# Patient Record
Sex: Female | Born: 1965 | Race: White | Hispanic: No | Marital: Married | Smoking: Never smoker
Health system: Southern US, Community
[De-identification: ages and names within clinical notes are randomized; demographics above are authoritative.]

---

## 2017-01-01 ENCOUNTER — Ambulatory Visit (INDEPENDENT_AMBULATORY_CARE_PROVIDER_SITE_OTHER): Payer: BC Managed Care – PPO | Admitting: Sports Medicine

## 2017-01-01 ENCOUNTER — Ambulatory Visit
Admission: RE | Admit: 2017-01-01 | Discharge: 2017-01-01 | Disposition: A | Discharge status: Home / Self Care | Payer: BC Managed Care – PPO | Source: Ambulatory Visit | Attending: Sports Medicine | Admitting: Sports Medicine

## 2017-01-01 VITALS — BP 150/79 | Ht 63.5 in | Wt 130.0 lb

## 2017-01-01 DIAGNOSIS — M25571 Pain in right ankle and joints of right foot: Secondary | ICD-10-CM | POA: Diagnosis not present

## 2017-01-01 NOTE — Progress Notes (Signed)
   Subjective:    Patient ID: Tiffany Russo, female    DOB: 1966/09/12, 51 y.o.   MRN: [id number]  HPI chief complaint: Right ankle pain  Very pleasant 51 year old runner comes in today complaining of 6 weeks of medial right ankle pain. No injury that she can recall but a gradual onset of pain that is most noticeable at the end of her runs. She is currently training for a half marathon. Her race is in 3 weeks. She does not have pain while running. She localizes her pain to the tip of her medial malleolus. She has noticed some swelling here. She has tried a compression sleeve and has found that to be helpful. She denies significant problems with her ankles in the past. She does admit to increasing her mileage 2-3 weeks prior to her pain.  Past medical history reviewed Medications reviewed Allergies reviewed    Review of Systems As above    Objective:   Physical Exam  Well-developed, fit appearing. No acute distress. Awake alert and oriented 3. Vital signs reviewed  Right ankle: Full range of motion. There is a mild amount of soft tissue swelling localized to the very inferior tip of the medial malleolus. There is a little bit of erythema and discoloration at this area. It is not warm to touch. No fluctuance. She is tender to palpation here. There is no other tenderness to palpation or percussion along the rest of the medial malleolus or along the distal third of the tibia. No tenderness to palpation along the posterior tibialis tendon. No ankle effusion.  Pes planus with standing. Patient is a midfoot striker when running and pronates bilaterally, right greater than left.  MSK ultrasound of the right ankle was performed. Limited images were obtained. There is a small area of swelling along the distal tip of the medial malleolus just proximal to the ankle joint. There is no cortical irregularity and no neovascularity. X-rays of the right ankle including AP and lateral views show no obvious  stress fracture.       Assessment & Plan:   Right ankle pain and swelling likely secondary to mild medial malleolar stress reaction  Since her pain is localized to the very inferior most portion of the medial malleolus and does not appear to involve the midportion of the malleolus I think that this injury is likely more biomechanical in nature due to her pes planus. I fitted her running shoes with scaphoid pads and she will need to decrease her running leading up to her half marathon. Since I do not see any obvious evidence of either a stress reaction or stress fracture I think she is okay to continue with her half marathon as scheduled. However, she is instructed to follow-up with me the week after her race for reevaluation. If her symptoms do not improve in the interim then I would consider further diagnostic imaging at follow-up. She may continue with her compression sleeve while running and I've also encouraged her to use ice after running.

## 2017-01-22 ENCOUNTER — Ambulatory Visit (INDEPENDENT_AMBULATORY_CARE_PROVIDER_SITE_OTHER): Payer: BC Managed Care – PPO | Admitting: Sports Medicine

## 2017-01-22 ENCOUNTER — Encounter: Payer: Self-pay | Admitting: Sports Medicine

## 2017-01-22 VITALS — BP 129/77 | Ht 63.5 in | Wt 130.0 lb

## 2017-01-22 DIAGNOSIS — M76821 Posterior tibial tendinitis, right leg: Secondary | ICD-10-CM | POA: Diagnosis not present

## 2017-01-22 NOTE — Progress Notes (Signed)
   Subjective:    Patient ID: Tiffany Russo, female    DOB: 1965-12-20, 51 y.o.   MRN: [id number]  HPI  Patient comes in today for follow-up on right ankle pain. Overall, her pain has improved. She did find the scaphoid pads to be helpful. She was able to complete her half marathon without any problem. She does still endorse some mild discomfort along the medial ankle. She has also found that wearing a compression sleeve is helpful.      Review of Systems     as above  Objective:   Physical Exam   Well-developed, well-nourished. No acute distress  Right ankle: Full range of motion. No effusion. No soft tissue swelling. She is tender to palpation along the posterior tibialis tendon just inferior to the medial malleolus. No pain with standing on tiptoes. Good strength. Neurovascularly intact distally. Pes planus with standing.  Brief MSK ultrasound today shows fluid around the posterior tibialis tendon.     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 She is also tender to palpation here. She has noticed improvement with scaphoid pads so we will schedule a follow-up appointment next week for custom orthotics. She will continue with her compression sleeve when running. I've also educated her in a modified eccentric heel drop with the foot in a pigeon toe position to isolate the posterior tibialis tendon.

## 2017-01-29 ENCOUNTER — Encounter: Payer: BC Managed Care – PPO | Admitting: Sports Medicine

## 2017-01-31 ENCOUNTER — Encounter: Payer: BC Managed Care – PPO | Admitting: Sports Medicine

## 2017-02-12 ENCOUNTER — Encounter: Payer: Self-pay | Admitting: Sports Medicine

## 2017-02-12 ENCOUNTER — Ambulatory Visit (INDEPENDENT_AMBULATORY_CARE_PROVIDER_SITE_OTHER): Payer: BC Managed Care – PPO | Admitting: Sports Medicine

## 2017-02-12 VITALS — BP 147/87 | Ht 63.5 in | Wt 132.0 lb

## 2017-02-12 DIAGNOSIS — M76821 Posterior tibial tendinitis, right leg: Secondary | ICD-10-CM

## 2017-02-12 NOTE — Progress Notes (Signed)
   Patient comes in today for custom orthotics. Please see the previous office notes for details regarding history and physical exam findings. In short, this patient has posterior tibialis tendinitis. She has received some symptom relief with temporary orthotics so we believe that custom orthotics are appropriate. She recently ran a 5K and came in first in her age group.  Custom orthotics were created for her today. She found them to be comfortable prior to leaving the office. 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 In the meantime, I've encouraged her to be more compliant with her home exercise program. She is okay to continue running while recovering from this injury. Total time spent with the patient was 30 minutes with greater than 50% of the time spent in face-to-face consultation discussing orthotic construction, instruction, and fitting.  Patient was fitted for a : standard, cushioned, semi-rigid orthotic. The orthotic was heated and afterward the patient stood on the orthotic blank positioned on the orthotic stand. The patient was positioned in subtalar neutral position and 10 degrees of ankle dorsiflexion in a weight bearing stance. After completion of molding, a stable base was applied to the orthotic blank. The blank was ground to a stable position for weight bearing. Size: 7 Base: Blue EVA Posting: none Additional orthotic padding: B/L metatarsal pads

## 2017-03-19 ENCOUNTER — Ambulatory Visit: Payer: BC Managed Care – PPO | Admitting: Sports Medicine

## 2018-10-04 IMAGING — DX DG ANKLE 2V *R*
2 series · 2 of 2 positions shown · non-contrast
Comparison: None.

CLINICAL DATA: Right ankle pain and swelling x 6 weeks, pain is
medial , there is NKI

EXAM:
RIGHT ANKLE - 2 VIEW

[dg ankle 2 views right (1 of 2)]
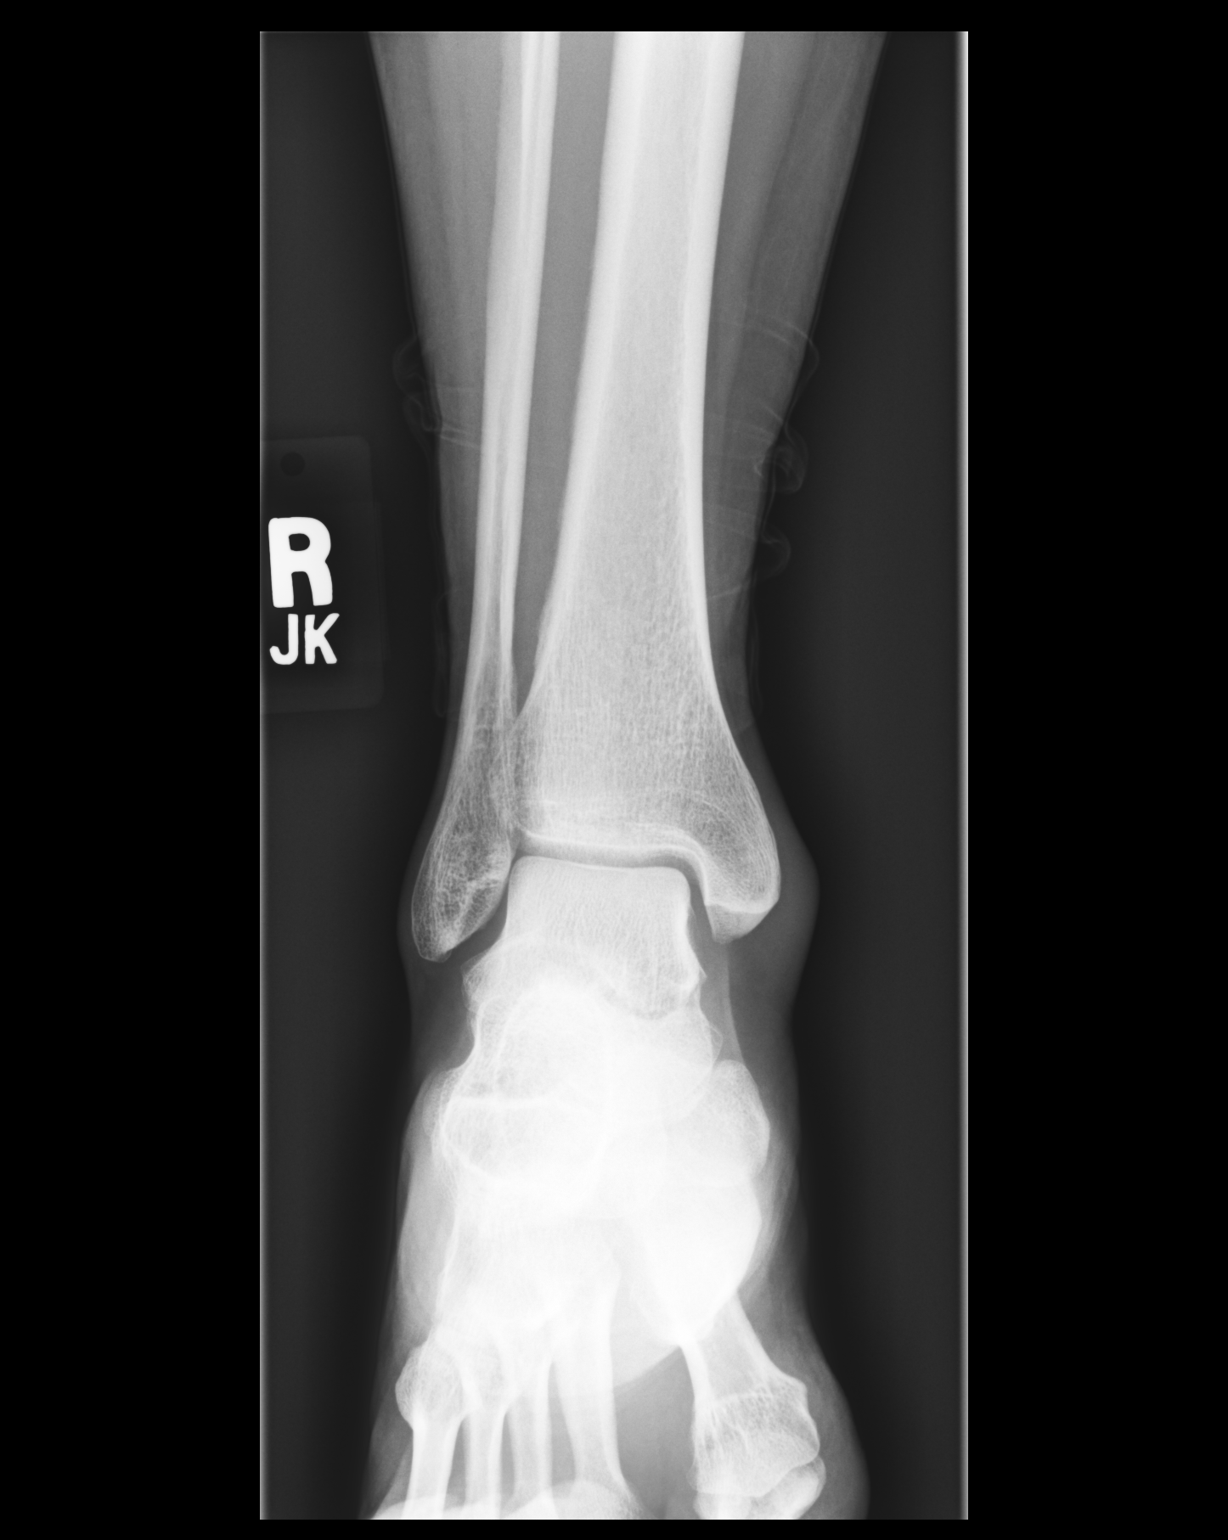

[dg ankle 2 views right (2 of 2)]
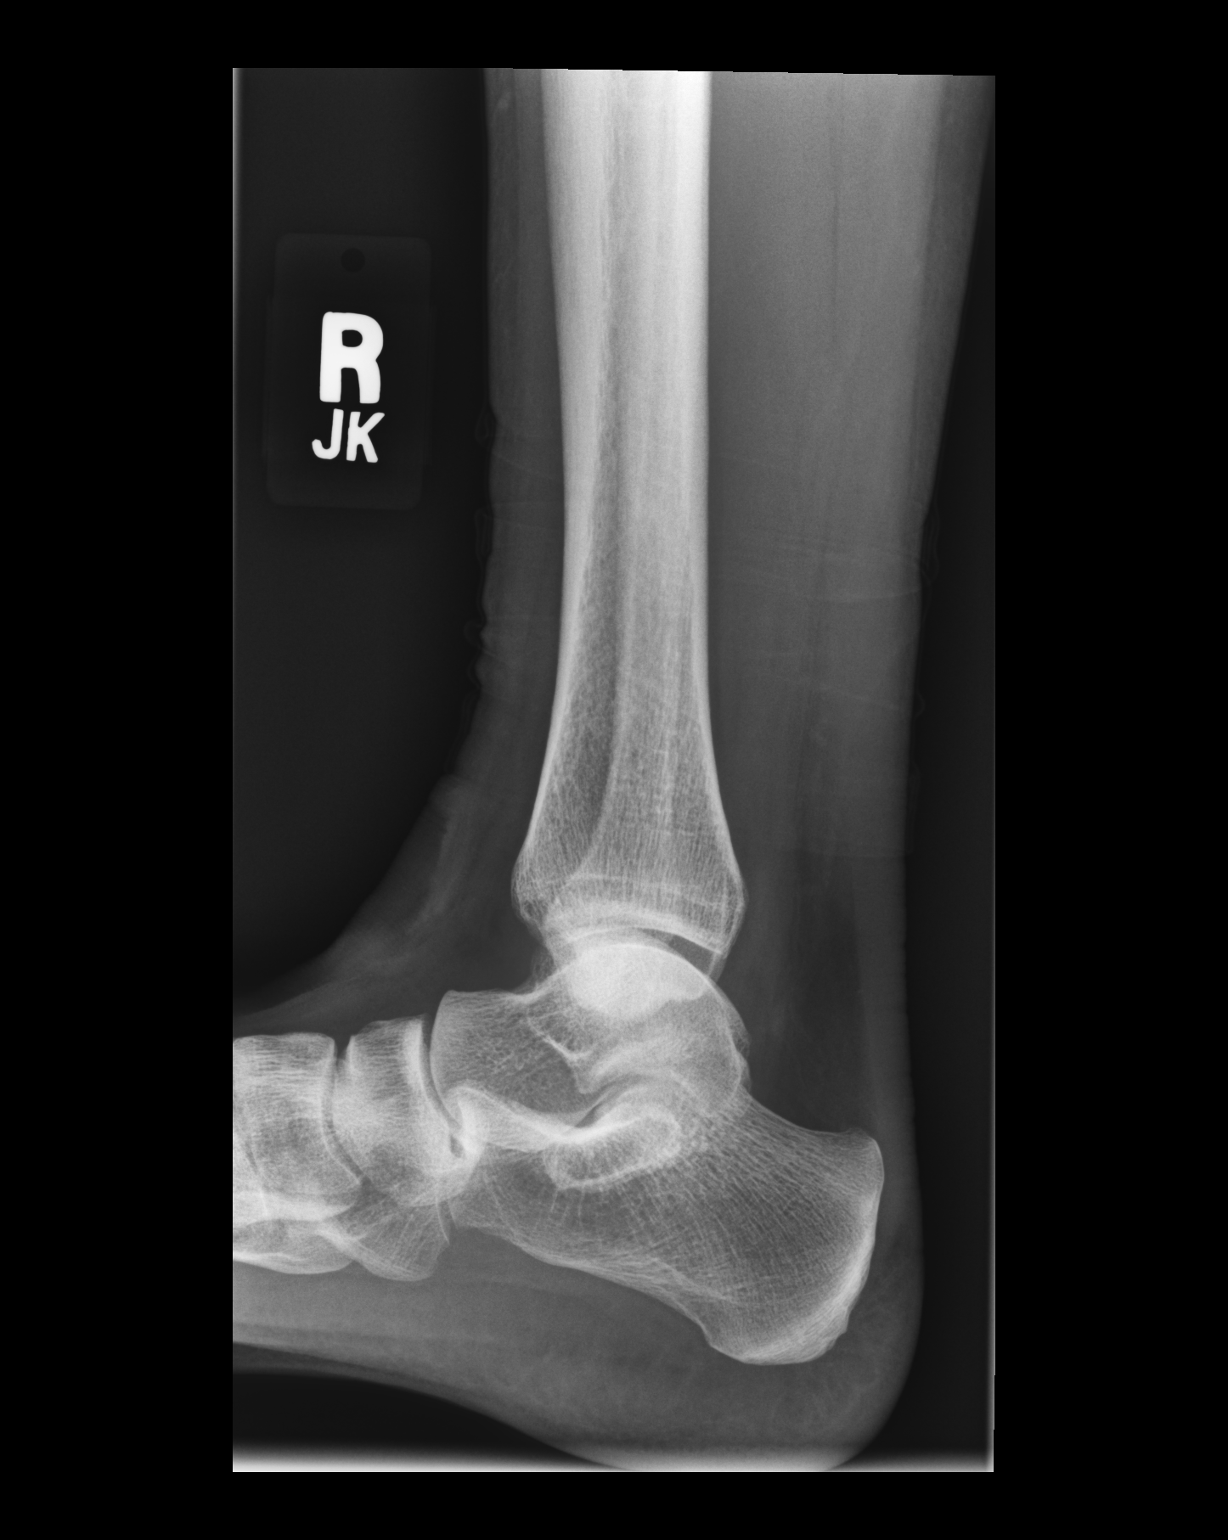

[2 of 2 positions shown; findings below may reference images not displayed]

FINDINGS: There is no evidence of fracture, dislocation, or joint effusion.
There is no evidence of arthropathy or other focal bone abnormality.
Medial soft tissue swelling.
IMPRESSION: Medial soft tissue swelling without fracture suggesting ligamentous
injury

## 2019-05-21 DEATH — deceased

## 2020-10-12 ENCOUNTER — Encounter: Payer: Self-pay | Admitting: Sports Medicine

## 2020-10-12 ENCOUNTER — Ambulatory Visit: Payer: BC Managed Care – PPO | Admitting: Sports Medicine

## 2020-10-12 ENCOUNTER — Other Ambulatory Visit: Payer: Self-pay

## 2020-10-12 VITALS — BP 102/70 | Ht 63.0 in | Wt 134.0 lb

## 2020-10-12 DIAGNOSIS — M25571 Pain in right ankle and joints of right foot: Secondary | ICD-10-CM | POA: Diagnosis not present

## 2020-10-12 DIAGNOSIS — G8929 Other chronic pain: Secondary | ICD-10-CM | POA: Insufficient documentation

## 2020-10-12 NOTE — Assessment & Plan Note (Signed)
Patient has an obvious swelling over her right medial malleolus, cause unknown at this time.  Does not appear to be arising from any tendon, does not appear to be homogenous fluid collection.  Would be cystic-like in nature given that it comes and goes.  She does have significant tenderness over this location as well.  Will obtain an x-ray to rule out stress fracture given the chronicity of her symptoms.  Also discussed MRI with patient, she would like to proceed in order to know what this is.  MRI will be scheduled as well.  In the meantime, she can use a compression sleeve.  We will follow up with her after imaging.

## 2020-10-12 NOTE — Progress Notes (Signed)
   Tiffany Russo is a 54 y.o. female who presents to [facility] today for the following:  Right Medial Ankle Swelling For the last year or so, she has noticed medial right ankle swelling directly over the medial malleolus that comes and goes States that usually will come and go over a day or so, but for the last week it has been swollen and sore which is new States that it is always tender to touch Denies any injury Has orthotics and runs She has been diagnosed with posterior tibialis tendinitis 3 years ago, states that this does not feel the same States that running does not bother it, only if something hits it or touches it First noticed it 1 week ago when she was trying on a boot and felt significant pain with the boot touched the area   PMH reviewed.  ROS as above. Medications reviewed.  Exam:  BP 102/70   Ht 5\' 3"  (1.6 m)   Wt 134 lb (60.8 kg)   BMI 23.74 kg/m  Gen: Well NAD MSK:  Right Ankle: - Inspection: Right medial malleolus with obvious swelling over bony prominence on right compared to left medial malleolus, otherwise no obvious deformity - Palpation: Tenderness to palpation directly over medial malleolus with dense cystic-like structure palpated over medial malleolus - Strength: Normal strength with dorsiflexion, plantarflexion, inversion, and eversion of foot; flexion and extension of toes b/l - ROM: Full ROM b/l - Neuro/vasc: NV intact b/l - Special Tests: Negative anterior drawer, normal inversion test b/l  Feet: no deformity noted.  Normal arch w/I pes cavus or planus, normal arch flexibility.    Limited right ankle:  -Medial malleolus: Hypoechoic heterogenous collection overlying medial malleolus, no increased Doppler flow.  No obvious connection to any surrounding structures, specifically posterior tibialis or anterior tibialis.  There is a small hypoechoic collection surrounding posterior tibialis on the right, consistent with mild tenosynovitis.  Slight bone  spurring noted of medial malleolus underlying cystic structure.   No results found.   Assessment and Plan: 1) Chronic pain of right ankle Patient has an obvious swelling over her right medial malleolus, cause unknown at this time.  Does not appear to be arising from any tendon, does not appear to be homogenous fluid collection.  Would be cystic-like in nature given that it comes and goes.  She does have significant tenderness over this location as well.  Will obtain an x-ray to rule out stress fracture given the chronicity of her symptoms.  Also discussed MRI with patient, she would like to proceed in order to know what this is.  MRI will be scheduled as well.  In the meantime, she can use a compression sleeve.  We will follow up with her after imaging.   [place], D.O.  PGY-3 Family Medicine  10/12/2020 4:53 PM   Patient seen and evaluated with the resident.  I agree with the above plan of care.  Ultrasound was nondiagnostic so I would like to work this up further.  We will start initially with x-rays but will likely need to proceed with an MRI to better evaluate the soft tissue mass at the medial malleolus.  Phone follow-up with MRI findings when available and we will delineate a more definitive treatment plan based on those results.

## 2020-10-12 NOTE — Patient Instructions (Signed)
I have placed an order for an x-ray of your right ankle.  Please go to [facility] at [facility] to have this completed.  You do not need an appointment.  We will contact you with your results afterwards.  Look around for a good MRI price for your ankle.  Let us know if you want to change the location.  You can wear a compression sleeve in the meantime.

## 2021-01-10 ENCOUNTER — Telehealth: Payer: Self-pay | Admitting: *Deleted

## 2021-01-10 NOTE — Telephone Encounter (Signed)
Faxed order to [facility] per pt request. Her new card ID number is [id number]. Pts MRI was approved with the authorization number of [id number].

## 2022-08-22 ENCOUNTER — Ambulatory Visit (INDEPENDENT_AMBULATORY_CARE_PROVIDER_SITE_OTHER): Payer: BC Managed Care – PPO | Admitting: Sports Medicine

## 2022-08-22 VITALS — BP 149/82 | Ht 63.5 in | Wt 135.0 lb

## 2022-08-22 DIAGNOSIS — G8929 Other chronic pain: Secondary | ICD-10-CM | POA: Diagnosis not present

## 2022-08-22 DIAGNOSIS — M7740 Metatarsalgia, unspecified foot: Secondary | ICD-10-CM

## 2022-08-22 DIAGNOSIS — M25571 Pain in right ankle and joints of right foot: Secondary | ICD-10-CM | POA: Diagnosis not present

## 2022-08-23 NOTE — Progress Notes (Signed)
Patient ID: Tiffany Russo, female   DOB: 07/29/1966, 56 y.o.   MRN: [id number]  Amorah presented to clinic today to discuss possible new custom orthotics.  Her current orthotics were made in 2018 and have held up very well.  She is recently began to experience some pain in the plantar aspect of her foot, specifically in the area of the metatarsal heads.  Inspection of her orthotics shows him to be in incredibly good shape.  Since she is not experiencing any pain in her ankle I think that we can continue with her current orthotic base as it does still maintain an excellent longitudinal arch.  We will however replace the metatarsal pads in an effort to treat her metatarsalgia.  If this does not resolve her pain then we could reconsider custom orthotics at a later date.  This note was dictated using Dragon naturally speaking software and may contain errors in syntax, spelling, or content which have not been identified prior to signing this note.

## 2023-02-12 ENCOUNTER — Other Ambulatory Visit: Payer: Self-pay
